# Patient Record
Sex: Female | Born: 1985 | Race: White | Hispanic: No | Marital: Single | Smoking: Never smoker
Health system: Southern US, Community
[De-identification: ages and names within clinical notes are randomized; demographics above are authoritative.]

## PROBLEM LIST (undated history)

## (undated) ENCOUNTER — Inpatient Hospital Stay (HOSPITAL_COMMUNITY): Payer: Self-pay

## (undated) HISTORY — PX: DILATION AND CURETTAGE OF UTERUS: SHX78

---

## 2014-02-19 ENCOUNTER — Emergency Department (HOSPITAL_COMMUNITY): Payer: No Typology Code available for payment source

## 2014-02-19 ENCOUNTER — Encounter (HOSPITAL_COMMUNITY): Payer: Self-pay | Admitting: Emergency Medicine

## 2014-02-19 ENCOUNTER — Inpatient Hospital Stay (HOSPITAL_COMMUNITY)
Admission: EM | Admit: 2014-02-19 | Discharge: 2014-02-20 | Disposition: A | Discharge status: Other Acute Inpatient Hospital | Payer: No Typology Code available for payment source | Attending: Obstetrics & Gynecology | Admitting: Obstetrics & Gynecology

## 2014-02-19 DIAGNOSIS — S46909A Unspecified injury of unspecified muscle, fascia and tendon at shoulder and upper arm level, unspecified arm, initial encounter: Secondary | ICD-10-CM | POA: Insufficient documentation

## 2014-02-19 DIAGNOSIS — N858 Other specified noninflammatory disorders of uterus: Secondary | ICD-10-CM

## 2014-02-19 DIAGNOSIS — O9989 Other specified diseases and conditions complicating pregnancy, childbirth and the puerperium: Secondary | ICD-10-CM | POA: Insufficient documentation

## 2014-02-19 DIAGNOSIS — N859 Noninflammatory disorder of uterus, unspecified: Secondary | ICD-10-CM | POA: Insufficient documentation

## 2014-02-19 DIAGNOSIS — T148XXA Other injury of unspecified body region, initial encounter: Secondary | ICD-10-CM

## 2014-02-19 DIAGNOSIS — S4980XA Other specified injuries of shoulder and upper arm, unspecified arm, initial encounter: Secondary | ICD-10-CM | POA: Insufficient documentation

## 2014-02-19 DIAGNOSIS — Y92410 Unspecified street and highway as the place of occurrence of the external cause: Secondary | ICD-10-CM | POA: Insufficient documentation

## 2014-02-19 DIAGNOSIS — M79602 Pain in left arm: Secondary | ICD-10-CM

## 2014-02-19 DIAGNOSIS — Y9389 Activity, other specified: Secondary | ICD-10-CM | POA: Insufficient documentation

## 2014-02-19 DIAGNOSIS — IMO0002 Reserved for concepts with insufficient information to code with codable children: Secondary | ICD-10-CM | POA: Insufficient documentation

## 2014-02-19 LAB — ABO/RH: ABO/RH(D): B POS

## 2014-02-19 MED ORDER — ACETAMINOPHEN 325 MG PO TABS: 650.0000 mg | ORAL_TABLET | Freq: Once | ORAL | Status: DC

## 2014-02-19 MED ORDER — ACETAMINOPHEN 500 MG PO TABS
500.0000 mg | ORAL_TABLET | Freq: Once | ORAL | Status: AC
  Administered 2014-02-19: 500 mg via ORAL
  Filled 2014-02-19: qty 1

## 2014-02-19 MED ORDER — LACTATED RINGERS IV BOLUS (SEPSIS)
500.0000 mL | Freq: Once | INTRAVENOUS | Status: AC
  Administered 2014-02-19: 500 mL via INTRAVENOUS

## 2014-02-19 NOTE — ED Notes (Signed)
[facility] arrived at 2032, but patient is still off the unit getting ultrasound completed.

## 2014-02-19 NOTE — ED Notes (Signed)
OB ultrasound team had been contacted to follow up on arrival time.  Dr. Jodi MourningZavitz informed that the patient is beginning to have contractions.
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
## 2014-02-19 NOTE — MAU Note (Signed)
Pt wanting to know if she can eat. Okayed with Susann Givens Braimah CNM student

## 2014-02-19 NOTE — Progress Notes (Signed)
Bedside u/s being done by dr. Jodi MourningZavitz. Baby moving. fhr between 160-170bpm per dr. Jodi Mourningzavitz.
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
## 2014-02-19 NOTE — Progress Notes (Signed)
Pt started having contractions 2-3 min apart with some uterine irritability.  Pt is not having any abdominal pain, bleeding, pt says that her bladder is not full, has recently emptied it.  Pt feels some tightening with contractions.  Abdomen is soft, non-tender, contractions mild by palpation. RROB RN spoke with Dr Jodi MourningZavitz and told of pt with contractions;  He then spoke [facility] and told of [facility] with contractions; decision made to [facility] to [facility] for further monitoring.  RN spoke with pt and family about plan of care.

## 2014-02-19 NOTE — ED Notes (Signed)
Report given to [facility] by Florentina AddisonKatie, [facility]
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
## 2014-02-20 DIAGNOSIS — O9989 Other specified diseases and conditions complicating pregnancy, childbirth and the puerperium: Secondary | ICD-10-CM

## 2014-02-20 DIAGNOSIS — O99891 Other specified diseases and conditions complicating pregnancy: Secondary | ICD-10-CM

## 2014-02-20 DIAGNOSIS — IMO0002 Reserved for concepts with insufficient information to code with codable children: Secondary | ICD-10-CM

## 2014-02-20 NOTE — Discharge Instructions (Signed)
Abrasions  An abrasion is a cut or scrape of the skin. Abrasions do not go through all layers of the skin.  HOME CARE  · If a bandage (dressing) was put on your wound, change it as told by your doctor. If the bandage sticks, soak it off with warm.  · Wash the area with water and soap 2 times a day. Rinse off the soap. Pat the area dry with a clean towel.  · Put on medicated cream (ointment) as told by your doctor.  · Change your bandage right away if it gets wet or dirty.  · Only take medicine as told by your doctor.  · See your doctor within 24 48 hours to get your wound checked.  · Check your wound for redness, puffiness (swelling), or yellowish-white fluid (pus).  GET HELP RIGHT AWAY IF:   · You have more pain in the wound.  · You have redness, swelling, or tenderness around the wound.  · You have pus coming from the wound.  · You have a fever or lasting symptoms for more than 2 3 days.  · You have a fever and your symptoms suddenly get worse.  · You have a bad smell coming from the wound or bandage.  MAKE SURE YOU:   · Understand these instructions.  · Will watch your condition.  · Will get help right away if you are not doing well or get worse.  Document Released: 05/26/2008 Document Revised: 09/01/2012 Document Reviewed: 11/11/2011  ExitCare® Patient Information ©2014 [employer].

## 2014-02-20 NOTE — MAU Provider Note (Signed)
Attestation of Attending Supervision of Advanced Practitioner (PA/CNM/NP): Evaluation and management procedures were performed by the Advanced Practitioner under my supervision and collaboration.  I have reviewed the Advanced Practitioner's note and chart, and I agree with the management and plan.  Sharika Mosquera, MD, FACOG Attending Obstetrician & Gynecologist Faculty Practice, [facility]  
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
## 2014-10-23 ENCOUNTER — Encounter (HOSPITAL_COMMUNITY): Payer: Self-pay | Admitting: Emergency Medicine

## 2014-12-25 ENCOUNTER — Encounter (HOSPITAL_COMMUNITY): Payer: Self-pay | Admitting: *Deleted
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
